# Patient Record
Sex: Male | Born: 2000 | Race: White | Hispanic: No | Marital: Single | Smoking: Never smoker
Health system: Southern US, Community
[De-identification: ages and names within clinical notes are randomized; demographics above are authoritative.]

---
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
## 2021-01-11 ENCOUNTER — Emergency Department: Payer: Medicaid Other

## 2021-01-11 ENCOUNTER — Other Ambulatory Visit: Payer: Self-pay

## 2021-01-11 ENCOUNTER — Emergency Department
Admission: EM | Admit: 2021-01-11 | Discharge: 2021-01-11 | Disposition: A | Discharge status: Home / Self Care | Payer: Medicaid Other | Attending: Emergency Medicine | Admitting: Emergency Medicine

## 2021-01-11 DIAGNOSIS — R569 Unspecified convulsions: Secondary | ICD-10-CM

## 2021-01-11 DIAGNOSIS — R519 Headache, unspecified: Secondary | ICD-10-CM | POA: Diagnosis not present

## 2021-01-11 LAB — COMPREHENSIVE METABOLIC PANEL
ALT: 20 U/L (ref 0–44)
AST: 31 U/L (ref 15–41)
Albumin: 4.9 g/dL (ref 3.5–5.0)
Alkaline Phosphatase: 84 U/L (ref 38–126)
Anion gap: 12 (ref 5–15)
BUN: <5 mg/dL — ABNORMAL LOW (ref 6–20)
CO2: 22 mmol/L (ref 22–32)
Calcium: 9.5 mg/dL (ref 8.9–10.3)
Chloride: 103 mmol/L (ref 98–111)
Creatinine, Ser: 0.98 mg/dL (ref 0.61–1.24)
GFR, Estimated: >60 mL/min (ref >60)
Glucose, Bld: 130 mg/dL — ABNORMAL HIGH (ref 70–99)
Potassium: 3.6 mmol/L (ref 3.5–5.1)
Sodium: 137 mmol/L (ref 135–145)
Total Bilirubin: 1.1 mg/dL (ref 0.3–1.2)
Total Protein: 9 g/dL — ABNORMAL HIGH (ref 6.5–8.1)

## 2021-01-11 LAB — CBC WITH DIFFERENTIAL/PLATELET
Abs Immature Granulocytes: 0.03 10*3/uL (ref 0.00–0.07)
Basophils Absolute: 0 10*3/uL (ref 0.0–0.1)
Basophils Relative: 0 %
Eosinophils Absolute: 0 10*3/uL (ref 0.0–0.5)
Eosinophils Relative: 0 %
HCT: 45.8 % (ref 39.0–52.0)
Hemoglobin: 16.6 g/dL (ref 13.0–17.0)
Immature Granulocytes: 1 %
Lymphocytes Relative: 34 %
Lymphs Abs: 2 10*3/uL (ref 0.7–4.0)
MCH: 31.6 pg (ref 26.0–34.0)
MCHC: 36.2 g/dL — ABNORMAL HIGH (ref 30.0–36.0)
MCV: 87.1 fL (ref 80.0–100.0)
Monocytes Absolute: 0.2 10*3/uL (ref 0.1–1.0)
Monocytes Relative: 4 %
Neutro Abs: 3.6 10*3/uL (ref 1.7–7.7)
Neutrophils Relative %: 61 %
Platelets: 191 10*3/uL (ref 150–400)
RBC: 5.26 MIL/uL (ref 4.22–5.81)
RDW: 13.4 % (ref 11.5–15.5)
WBC: 5.8 10*3/uL (ref 4.0–10.5)
nRBC: 0 % (ref 0.0–0.2)

## 2021-01-11 LAB — MAGNESIUM: Magnesium: 1.9 mg/dL (ref 1.7–2.4)

## 2021-01-11 LAB — LACTIC ACID, PLASMA
Lactic Acid, Venous: 2.5 mmol/L (ref 0.5–1.9)
Lactic Acid, Venous: 1.1 mmol/L (ref 0.5–1.9)

## 2021-01-11 MED ORDER — LEVETIRACETAM 500 MG PO TABS
500.0000 mg | ORAL_TABLET | Freq: Once | ORAL | Status: AC
  Administered 2021-01-11: 500 mg via ORAL
  Filled 2021-01-11: qty 1

## 2021-01-11 MED ORDER — LEVETIRACETAM 500 MG PO TABS: 500.0000 mg | ORAL_TABLET | Freq: Two times a day (BID) | ORAL | 1 refills | Status: DC

## 2021-01-11 MED ORDER — LACTATED RINGERS IV BOLUS
1000.0000 mL | Freq: Once | INTRAVENOUS | Status: AC
  Administered 2021-01-11: 1000 mL via INTRAVENOUS

## 2021-01-11 MED ORDER — ACETAMINOPHEN 500 MG PO TABS
1000.0000 mg | ORAL_TABLET | Freq: Once | ORAL | Status: AC
  Administered 2021-01-11: 1000 mg via ORAL
  Filled 2021-01-11: qty 2

## 2021-01-11 NOTE — ED Notes (Signed)
Gave md verbal of critical lactate

## 2021-01-11 NOTE — ED Provider Notes (Addendum)
-----------------------------------------   9:21 PM on 01/11/2021 -----------------------------------------  Initial lactate was slightly elevated consistent with a seizure.  Repeat is negative after fluids.  The is little bit sleepy after Keppra but has had no recurrent seizure activity.  He is stable for discharge home.  I counseled him and the father on the results of the work-up and follow-up plan.  Return precautions given, and they expressed understanding.  ED ECG REPORT I, Dionne Bucy, the attending physician, personally viewed and interpreted this ECG.  Date: 01/11/2021 EKG Time: 2021 Rate: 62 Rhythm: normal sinus rhythm QRS Axis: normal Intervals: normal ST/T Wave abnormalities: Early repolarization Narrative Interpretation: no evidence of acute ischemia    Dionne Bucy, MD 01/11/21 2121    Dionne Bucy, MD 01/11/21 2134

## 2021-01-11 NOTE — ED Notes (Signed)
Patient transported to CT 

## 2021-01-11 NOTE — Discharge Instructions (Addendum)
You are being started on an antiseizure medication, Keppra, to prevent further seizures.   As we discussed, please follow-up with neurology in the clinic to discuss starting antiseizure medications and for further testing such as EEG.  Return to the ED with any further seizure-like activity.

## 2021-01-11 NOTE — ED Provider Notes (Signed)
[facility] Emergency Department Provider Note ____________________________________________   Event Date/Time   First MD Initiated Contact with Patient 01/11/21 1816     (approximate)  I have reviewed the triage vital signs and the nursing notes.  HISTORY  Chief Complaint Seizures   HPI Brett Davies. is a 20 y.o. Brett Davies presents to the ED for evaluation of possible seizure.   Chart review indicates no relevant hx.   Patient presents to the ED via EMS from local bike rally where he had witnessed seizure-like activity.  EMS reports finding him confused on the scene, possibly postictal, due to bystanders indicating generalized seizure activity was witnessed for 1-2 minutes.  Patient reports that he took 300 mg of Lyrica recreationally prior to this, but no other ingestions.  No recent illnesses.  He reports feeling fine this morning and yesterday.  He reports he does not know what happened he "just went out" and then woke in the ambulance on the way here.  Majority of history is provided by father, who arrived shortly after the patient.  Father reports the patient abruptly came to him and says that something is wrong and that he did not feel good, and then father witnessed him stare off into space, slumped to the ground, and for 1-2 minutes his bilateral arms were flexed, tight and clenched with fine rhythmic motion.  His bilateral legs are both extended and finely tremulous.  No incontinence of urine or stool.  Here in the ED, patient reports a mild global headache.  No past medical history on file.  There are no problems to display for this patient.   No past surgical history on file.  Prior to Admission medications   Medication Sig Start Date End Date Taking? Authorizing Provider  levETIRAcetam (KEPPRA) 500 MG tablet Take 1 tablet (500 mg total) by mouth 2 (two) times daily. 01/11/21  Yes Delton Prairie, MD  acetaminophen-codeine (TYLENOL #3)  300-30 MG tablet Take 1 tablet by mouth every 4 (four) hours as needed for moderate pain. 08/31/16   Cuthriell, Delorise Royals, PA-C  methylPREDNISolone (MEDROL DOSEPAK) 4 MG TBPK tablet Take Tapered dose as directed 05/27/19   Joni Reining, PA-C  sulfamethoxazole-trimethoprim (BACTRIM DS) 800-160 MG tablet Take 1 tablet by mouth 2 (two) times daily. 05/27/19   Joni Reining, PA-C    Allergies Patient has no known allergies.  Family History  Problem Relation Age of Onset   Healthy Father     Social History Social History   Tobacco Use   Smoking status: Never   Smokeless tobacco: Never  Substance Use Topics   Alcohol use: No   Drug use: No    Review of Systems  Constitutional: No fever/chills Eyes: No visual changes. ENT: No sore throat. Cardiovascular: Denies chest pain. Respiratory: Denies shortness of breath. Gastrointestinal: No abdominal pain.  No nausea, no vomiting.  No diarrhea.  No constipation. Genitourinary: Negative for dysuria. Musculoskeletal: Negative for back pain. Skin: Negative for rash. Neurological: Negative for focal weakness or numbness.  Positive for headache and witnessed seizure-like activity ____________________________________________   PHYSICAL EXAM:  VITAL SIGNS: Vitals:   01/11/21 1822  BP: 122/64  Pulse: (!) 110  Resp: 18  Temp: 98.2 F (36.8 C)  SpO2: 97%     Constitutional: Alert and oriented. Well appearing and in no acute distress.  Sitting up in bed and conversational.  T shirt is wet with sweat. Eyes: Conjunctivae are normal. PERRL. EOMI. Head: Atraumatic. Nose:  No congestion/rhinnorhea. Mouth/Throat: Mucous membranes are dry.  Oropharynx non-erythematous. Neck: No stridor. No cervical spine tenderness to palpation. Cardiovascular: Tachycardic rate, regular rhythm. Grossly normal heart sounds.  Good peripheral circulation. Respiratory: Normal respiratory effort.  No retractions. Lungs CTAB. Gastrointestinal: Soft ,  nondistended, nontender to palpation. No CVA tenderness. Musculoskeletal: No lower extremity tenderness nor edema.  No joint effusions. No signs of acute trauma. Neurologic:  Normal speech and language. No gross focal neurologic deficits are appreciated.  Cranial nerves II through XII intact 5/5 strength and sensation in all 4 extremities Skin:  Skin is warm, dry and intact. No rash noted. Psychiatric: Mood and affect are normal. Speech and behavior are normal. ____________________________________________   LABS (all labs ordered are listed, but only abnormal results are displayed)  Labs Reviewed  COMPREHENSIVE METABOLIC PANEL - Abnormal; Notable for the following components:      Result Value   Glucose, Bld 130 (*)    BUN <5 (*)    Total Protein 9.0 (*)    All other components within normal limits  CBC WITH DIFFERENTIAL/PLATELET - Abnormal; Notable for the following components:   MCHC 36.2 (*)    All other components within normal limits  MAGNESIUM  LACTIC ACID, PLASMA  LACTIC ACID, PLASMA   ____________________________________________  12 Lead EKG   ____________________________________________  RADIOLOGY  ED MD interpretation: CT head reviewed by me without evidence of acute cranial pathology.  Official radiology report(s): CT HEAD WO CONTRAST ( )  Result Date: 01/11/2021 CLINICAL DATA:  New onset seizure. Evaluate for mass or intracranial abnormality. EXAM: CT HEAD WITHOUT CONTRAST TECHNIQUE: Contiguous axial images were obtained from the base of the skull through the vertex without intravenous contrast. COMPARISON:  CT head 07/19/2014. FINDINGS: Brain: No evidence of large-territorial acute infarction. No parenchymal hemorrhage. No mass lesion. No extra-axial collection. No mass effect or midline shift. No hydrocephalus. Basilar cisterns are patent. Vascular: No hyperdense vessel. Skull: No acute fracture or focal lesion. Sinuses/Orbits: Paranasal sinuses and mastoid air  cells are clear. The orbits are unremarkable. Other: None. IMPRESSION: No acute intracranial abnormality. Electronically Signed   By: Tish Frederickson M.D.   On: 01/11/2021 19:16    ____________________________________________   PROCEDURES and INTERVENTIONS  Procedure(s) performed (including Critical Care):  .1-3 Lead EKG Interpretation  Date/Time: 01/11/2021 7:54 PM Performed by: Delton Prairie, MD Authorized by: Delton Prairie, MD     Interpretation: abnormal     ECG rate:  110   ECG rate assessment: tachycardic     Rhythm: sinus tachycardia     Ectopy: none     Conduction: normal    Medications  levETIRAcetam (KEPPRA) tablet 500 mg (has no administration in time range)  lactated ringers bolus 1,000 mL (1,000 mLs Intravenous New Bag/Given 01/11/21 1853)  acetaminophen (TYLENOL) tablet 1,000 mg (1,000 mg Oral Given 01/11/21 1854)    ____________________________________________   MDM / ED COURSE   Otherwise healthy 20 year old male presents to the ED with first-time seizure-like activity.  Presents little sleepy, rapidly improving while in the ED, possibly postictal in etiology.  He has no evidence of trauma or neurologic deficits.  CT without evidence of ICH or mass.  Electrolytes and basic labs are unremarkable.  We will empirically start him on Keppra and have him follow-up with neurology as an outpatient.  Return precautions were discussed.      ____________________________________________   FINAL CLINICAL IMPRESSION(S) / ED DIAGNOSES  Final diagnoses:  Seizure-like activity [facility])     ED Discharge Orders  Ordered    levETIRAcetam (KEPPRA) 500 MG tablet  2 times daily        01/11/21 1951             Shemekia Patane Katrinka Blazing   Note:  This document was prepared using Conservation officer, historic buildings and may include unintentional dictation errors.    Delton Prairie, MD 01/11/21 [id number]

## 2021-01-11 NOTE — ED Triage Notes (Signed)
Pt presents to the ED via EMS with c/o seizure. Pt was at a friend's house when he had witnessed clonic tonic seizure that lasted 1-2 minutes. Pt denies any history of seizures. Pt now A&Ox4 with stable vital signs.

## 2021-09-30 ENCOUNTER — Encounter: Payer: Self-pay | Admitting: Emergency Medicine

## 2021-09-30 ENCOUNTER — Emergency Department
Admission: EM | Admit: 2021-09-30 | Discharge: 2021-09-30 | Disposition: A | Discharge status: Home / Self Care | Payer: Medicaid Other | Attending: Emergency Medicine | Admitting: Emergency Medicine

## 2021-09-30 DIAGNOSIS — X58XXXA Exposure to other specified factors, initial encounter: Secondary | ICD-10-CM | POA: Diagnosis not present

## 2021-09-30 DIAGNOSIS — T1591XA Foreign body on external eye, part unspecified, right eye, initial encounter: Secondary | ICD-10-CM

## 2021-09-30 DIAGNOSIS — T1501XA Foreign body in cornea, right eye, initial encounter: Secondary | ICD-10-CM | POA: Diagnosis present

## 2021-09-30 MED ORDER — TOBRAMYCIN 0.3 % OP SOLN
2.0000 [drp] | Freq: Once | OPHTHALMIC | Status: DC
  Filled 2021-09-30: qty 5

## 2021-09-30 MED ORDER — OFLOXACIN 0.3 % OP SOLN
1.0000 [drp] | Freq: Once | OPHTHALMIC | Status: AC
  Administered 2021-09-30: 1 [drp] via OPHTHALMIC
  Filled 2021-09-30: qty 5

## 2021-09-30 MED ORDER — FLUORESCEIN SODIUM 1 MG OP STRP
1.0000 | ORAL_STRIP | Freq: Once | OPHTHALMIC | Status: AC
  Administered 2021-09-30: 1 via OPHTHALMIC
  Filled 2021-09-30: qty 1

## 2021-09-30 MED ORDER — TETRACAINE HCL 0.5 % OP SOLN
2.0000 [drp] | Freq: Once | OPHTHALMIC | Status: AC
  Administered 2021-09-30: 2 [drp] via OPHTHALMIC
  Filled 2021-09-30: qty 4

## 2021-09-30 NOTE — ED Notes (Signed)
Right eye irrigated with morgan lens using normal saline per Dr. Dolores Frame orders ?

## 2021-09-30 NOTE — ED Notes (Signed)
Pt discharge information reviewed. Pt understands need for follow up care and when to return if symptoms worsen. All questions answered. Pt is alert and oriented with even and regular respirations. Pt is seen ambulating out of department with strong steady gait with family. ?

## 2021-09-30 NOTE — ED Notes (Signed)
Eye irrigation completed. Pt able to tolerate 1000 L NS. ?

## 2021-09-30 NOTE — ED Triage Notes (Signed)
Pt presents via POV with complaints of a pinpoint black dot on his cornea that he noticed last Thursday. Pts Dad notes attempting to get the "speck" out of his eye without success. Denies blurred vision or other vision changes.  ?

## 2021-09-30 NOTE — ED Notes (Signed)
Woods lamp, eye drops, and eye strips placed at bedside  ?

## 2021-09-30 NOTE — Discharge Instructions (Signed)
Apply antibiotic eyedrop 1 drop to right eye 4 times daily while awake x7 days.  Return to the ER for worsening symptoms, persistent vomiting, or other concerns. ?

## 2021-09-30 NOTE — ED Notes (Signed)
Visual acuity  ? ?Right eye 20/25 ?Left eye 20/30 ?Both eyes 20/25 ?

## 2021-09-30 NOTE — ED Provider Notes (Signed)
? ?[facility] ?Provider Note ? ? ? Event Date/Time  ? First MD Initiated Contact with Patient 09/30/21 0354   ?  (approximate) ? ? ?History  ? ?Foreign Body in Eye ? ? ?HPI ? ?Brett Davies. is a 21 y.o. male who presents to the ED from home with his father with a chief complaint of foreign body to his right eye.  Patient was grinding his bike 5 days ago and thinks he got a speck of paint in his right eye.  Denies pain or irritation.  Told his father about it yesterday and his father attempted to get the speck out of his right eye with a Q-tip.  Father describes a speck embedded in the surface of patient's eye.  Patient does not wear corrective lenses.  Denies blurry vision or other vision changes. ?  ? ? ?Past Medical History  ?History reviewed. No pertinent past medical history. ? ? ?Active Problem List  ?There are no problems to display for this patient. ? ? ? ?Past Surgical History  ?History reviewed. No pertinent surgical history. ? ? ?Home Medications  ? ?Prior to Admission medications   ?Medication Sig Start Date End Date Taking? Authorizing Provider  ?acetaminophen-codeine (TYLENOL #3) 300-30 MG tablet Take 1 tablet by mouth every 4 (four) hours as needed for moderate pain. 08/31/16   Cuthriell, Delorise Royals, PA-C  ?levETIRAcetam (KEPPRA) 500 MG tablet Take 1 tablet (500 mg total) by mouth 2 (two) times daily. 01/11/21   Delton Prairie, MD  ?methylPREDNISolone (MEDROL DOSEPAK) 4 MG TBPK tablet Take Tapered dose as directed 05/27/19   Joni Reining, PA-C  ?sulfamethoxazole-trimethoprim (BACTRIM DS) 800-160 MG tablet Take 1 tablet by mouth 2 (two) times daily. 05/27/19   Joni Reining, PA-C  ? ? ? ?Allergies  ?Patient has no known allergies. ? ? ?Family History  ? ?Family History  ?Problem Relation Age of Onset  ? Healthy Father   ? ? ? ?Physical Exam  ?Triage Vital Signs: ?ED Triage Vitals  ?Enc Vitals Group  ?   BP 09/30/21 0047 116/80  ?   Pulse Rate 09/30/21 0047 62  ?   Resp  09/30/21 0047 17  ?   Temp 09/30/21 0047 97.8 ?F (36.6 ?C)  ?   Temp Source 09/30/21 0047 Oral  ?   SpO2 09/30/21 0047 100 %  ?   Weight 09/30/21 0046 124 lb (56.2 kg)  ?   Height 09/30/21 0046 5\' 8"  (1.727 m)  ?   Head Circumference --   ?   Peak Flow --   ?   Pain Score 09/30/21 0046 8  ?   Pain Loc --   ?   Pain Edu? --   ?   Excl. in [place]? --   ? ? ?Updated Vital Signs: ?BP 116/80 (BP Location: Left Arm)   Pulse 62   Temp 97.8 ?F (36.6 ?C) (Oral)   Resp 17   Ht 5\' 8"  (1.727 m)   Wt 56.2 kg   SpO2 100%   BMI 18.85 kg/m?  ? ? ?General: Awake, no distress.  ?CV:  Good peripheral perfusion.  ?Resp:  Normal effort.  ?Abd:  No distention.  ?Other:  VA noted. Right eye numbed with tetracaine, stained with fluorescein and examined under Woods lamp.  PERRL.  EOMI.  Globe is intact.  Tiny dot in the center of patient's cornea. ? ? ?ED Results / Procedures / Treatments  ?Labs ?(all labs ordered are  listed, but only abnormal results are displayed) ?Labs Reviewed - No data to display ? ? ?EKG ? ?None ? ? ?RADIOLOGY ?None ? ? ?Official radiology report(s): ?No results found. ? ? ?PROCEDURES: ? ?Critical Care performed: No ? ?Procedures ? ? ?MEDICATIONS ORDERED IN ED: ?Medications  ?ofloxacin (OCUFLOX) 0.3 % ophthalmic solution 1 drop (has no administration in time range)  ?tetracaine (PONTOCAINE) 0.5 % ophthalmic solution 2 drop (2 drops Left Eye Given by Other 09/30/21 0452)  ?fluorescein ophthalmic strip 1 strip (1 strip Left Eye Given by Other 09/30/21 0454)  ? ? ? ?IMPRESSION / MDM / ASSESSMENT AND PLAN / ED COURSE  ?I reviewed the triage vital signs and the nursing notes. ?             ?               ?21 year old male presenting with right eye foreign body.  Tiny dot noted to central cornea, presumably foreign body.  Will irrigate with Lequita Halt lens.  Apply Tobrex drops.  Will reassess. ? ?0435 ?Pharmacy out of Tobrex eyedrops; will apply Ocuflox. ? ?0622 ?Reexamined right eye with Woods lamp after irrigation.  Foreign  body appears to be gone.  Father had also looked at the eye and thought foreign body was gone as well.  Out of an abundance of caution, encourage patient to continue antibiotic eyedrops and follow up closely with ophthalmology.  Strict return precautions given.  Patient and family member verbalized understanding and agree with plan of care. ? ?FINAL CLINICAL IMPRESSION(S) / ED DIAGNOSES  ? ?Final diagnoses:  ?Eye foreign body, right, initial encounter  ? ? ? ?Rx / DC Orders  ? ?ED Discharge Orders   ? ? None  ? ?  ? ? ? ?Note:  This document was prepared using Dragon voice recognition software and may include unintentional dictation errors. ?  ?Irean Hong, MD ?09/30/21 [phone] ? ?

## 2024-05-04 ENCOUNTER — Emergency Department

## 2024-05-04 ENCOUNTER — Other Ambulatory Visit: Payer: Self-pay

## 2024-05-04 ENCOUNTER — Emergency Department
Admission: EM | Admit: 2024-05-04 | Discharge: 2024-05-04 | Disposition: A | Discharge status: Home / Self Care | Source: Home / Self Care | Attending: Emergency Medicine | Admitting: Emergency Medicine

## 2024-05-04 DIAGNOSIS — M62830 Muscle spasm of back: Secondary | ICD-10-CM | POA: Diagnosis present

## 2024-05-04 MED ORDER — LIDOCAINE 5 % EX PTCH
1.0000 | MEDICATED_PATCH | CUTANEOUS | Status: DC
  Administered 2024-05-04: 20:00:00 1 via TRANSDERMAL
  Filled 2024-05-04: qty 1

## 2024-05-04 MED ORDER — CYCLOBENZAPRINE HCL 5 MG PO TABS: 5.0000 mg | ORAL_TABLET | Freq: Three times a day (TID) | ORAL | 0 refills | Status: DC | PRN

## 2024-05-04 MED ORDER — CYCLOBENZAPRINE HCL 10 MG PO TABS
5.0000 mg | ORAL_TABLET | Freq: Once | ORAL | Status: AC
  Administered 2024-05-04: 20:00:00 5 mg via ORAL
  Filled 2024-05-04: qty 1

## 2024-05-04 MED ORDER — IBUPROFEN 800 MG PO TABS
800.0000 mg | ORAL_TABLET | Freq: Once | ORAL | Status: AC
  Administered 2024-05-04: 20:00:00 800 mg via ORAL
  Filled 2024-05-04: qty 1

## 2024-05-04 NOTE — ED Provider Notes (Signed)
° ° °  [facility] Emergency Department Provider Note     Event Date/Time   First MD Initiated Contact with Patient 05/04/24 Brett Davies     (approximate)   History   Back Pain   HPI  Brett Davies. is a 23 y.o. male ***       Physical Exam   Triage Vital Signs: ED Triage Vitals [05/04/24 1721]  Encounter Vitals Group     BP 124/78     Girls Systolic BP Percentile      Girls Diastolic BP Percentile      Boys Systolic BP Percentile      Boys Diastolic BP Percentile      Pulse Rate (!) 107     Resp 18     Temp 98.1 F (36.7 C)     Temp Source Oral     SpO2 99 %     Weight 122 lb (55.3 kg)     Height 5' 10 (1.778 m)     Head Circumference      Peak Flow      Pain Score 9     Pain Loc      Pain Education      Exclude from Growth Chart     Most recent vital signs: Vitals:   05/04/24 1721  BP: 124/78  Pulse: (!) 107  Resp: 18  Temp: 98.1 F (36.7 C)  SpO2: 99%    General Awake, no distress. *** {**HEENT NCAT. PERRL. EOMI. No rhinorrhea. Mucous membranes are moist. **} CV:  Good peripheral perfusion. *** RESP:  Normal effort. *** ABD:  No distention. *** {**Other: **}   ED Results / Procedures / Treatments   Labs (all labs ordered are listed, but only abnormal results are displayed) Labs Reviewed - No data to display  EKG  ***  RADIOLOGY  {**I personally viewed and evaluated these images as part of my medical decision making, as well as reviewing the written report by the radiologist.  ED Provider Interpretation: ***  No results found.  PROCEDURES:  Critical Care performed: {CriticalCareYesNo:19197::Yes, see critical care procedure note(s),No}  Procedures   MEDICATIONS ORDERED IN ED: Medications - No data to display   IMPRESSION / MDM / ASSESSMENT AND PLAN / ED COURSE  I reviewed the triage vital signs and the nursing notes.                                 23 y.o. male presents to the emergency  department for evaluation and treatment of ***. See HPI for further details.   Differential diagnosis includes, but is not limited to ***   Patient's presentation is most consistent with {EM COPA:27473}  {**The patient is on the cardiac monitor to evaluate for evidence of arrhythmia and/or significant heart rate changes.**}  The patient is in stable and satisfactory condition for discharge home. Encouraged to follow up with *** for further management. ED precautions discussed. All questions and concerns were addressed during this ED visit.     FINAL CLINICAL IMPRESSION(S) / ED DIAGNOSES   Final diagnoses:  None     Rx / DC Orders   ED Discharge Orders     None        Note:  This document was prepared using Dragon voice recognition software and may include unintentional dictation errors.

## 2024-05-04 NOTE — ED Triage Notes (Addendum)
 Pt here with back pain for a week but getting worse. Pt states pain is upper lumbar in region and describes the pain as sharp in nature. Pt denies any falls or injuries.

## 2024-05-04 NOTE — Discharge Instructions (Addendum)
 You were evaluated in the ED for back pain.  Your lumbar and thoracic spine x-rays are normal.  Please get plenty of rest and take medication as discussed.  If any new or worsening symptoms occur please return to ED for further evaluation.  Pain control:  Ibuprofen  (motrin /aleve/advil ) - You can take 3 tablets (600 mg) every 6 hours as needed for pain.  Acetaminophen  (tylenol ) - You can take 2 extra strength tablets (1000 mg) every 6 hours as needed for pain.  You can alternate these medications or take them together.  Make sure you eat food/drink water when taking these medications.

## 2024-06-13 ENCOUNTER — Ambulatory Visit: Payer: Self-pay

## 2024-06-13 DIAGNOSIS — Z113 Encounter for screening for infections with a predominantly sexual mode of transmission: Secondary | ICD-10-CM

## 2024-06-13 LAB — HM HIV SCREENING LAB: HM HIV Screening: NEGATIVE

## 2024-06-13 NOTE — Progress Notes (Signed)
 " [facility] Department STI clinic [address] [place] [address] Main phone: [phone]  STI screening visit  Subjective:  Brett Davies. is a 24 y.o. male being seen today for an STI screening visit. The patient reports they do not have symptoms.    Patient has the following medical conditions:  There are no active problems to display for this patient.  Chief Complaint  Patient presents with   SEXUALLY TRANSMITTED DISEASE   HPI Patient reports desire for STI testing. No symptoms.  Reproductive considerations: Does the patient or their partner desires a pregnancy in the next year? No  See flowsheet for further details and programmatic requirements  Hyperlink available at the top of the signed note in blue.  Flow sheet content below:  Pregnancy Intention Screening Does the patient want to become pregnant in the next year?: N/A Does the patient's partner want to become pregnant in the next year?: No Would the patient like to discuss contraceptive options today?: N/A Reason For STD Screen STD Screening: Is asymptomatic Have you ever had an STD?: No History of Antibiotic use in the past 2 weeks?: No STD Symptoms Denies all: Yes Risk Factors for Hep B Household, sexual, or needle sharing contact of a person infected with Hep B: No Sexual contact with a person who uses drugs not as prescribed?: No Currently or Ever used drugs not as prescribed: No HIV Positive: No PRep Patient: No Men who have sex with men: No Have Hepatitis C: No History of Incarceration: No History of Homeslessness?: No Anal sex following anal drug use?: No Risk Factors for Hep C Currently using drugs not as prescribed: No Sexual partner(s) currently using drugs as not prescribed: No History of drug use: No HIV Positive: No People with a history of incarceration: No People born between the years of 56 and 62: No Counseling Patient  counseled to use condoms with all sex: Condoms declined RTC in 2-3 weeks for test results: Yes Clinic will call if test results abnormal before test result appt.: Yes Immunizations: Referred, Immunization history assessed Test results given to patient Patient counseled to use condoms with all sex: Condoms declined  Screening for MPX risk:  Unexplained rash?  No   MSM?  No   Multiple or anonymous sex partners?  No   Any close or sexual contact with a person  diagnosed with MPX?  No   Any outside the [place]  where MPX is endemic?  No   High clinical suspicion for MPX?    -Unlikely to be chickenpox    -Lymphadenopathy    -Rash that presents in same phase of       evolution on any given body part  No   Does this patient meet CDC recommendations for vaccination against MPOX? No  You already have or anticipate having the following risks:  Your sex partner has the following risks: You're traveling to a county with a clade I MPOX outbreak and anticipate these risks: Occupational exposure  You had known or suspected exposure to someone with monkeypox You had a sex partner in the past 2 weeks who was diagnosed with monkeypox You are a gay, bisexual, or other man who has sex with men, or are transgender or nonbinary and in the past 6 months have had any of the following: - A new diagnosis of one or more sexually transmitted diseases (e.g., chlamydia, gonorrhea, or syphilis) - More than one sex partner You have had any  of the following in the past 6 months: - Sex at a commercial sex venue (like a sex club or bathhouse) - Sex related to a large commercial event   or in a geographic area (city or county for example) where mpox virus transmission is occurring Sex with a new partner Sex at a commercial sex venue (e.g., a sex club or bathhouse) Sex in it consultant for money, goods, drugs, or other trade Sex in association with a large public event (e.g., a rave, party, or festival) i.e. certain people who  work in a tax adviser or healthcare facility   Infectious disease screenings: Vaccinated against HPV? Yes  HIV Ever had a positive? No Last test: unknown Results in chart:  No results found for: HMHIVSCREEN No results found for: HIV   Hep B Hep B status: unknown or no prior testing Received HBV vaccination? Yes Received HBV testing for immunity? Unknown Results in chart:  No components found for: HMHEPBSCREEN  Do they qualify for HBV screening today? No Criteria:  -Household, sexual or needle sharing contact with HBV -History of drug use or homelessness -HIV positive -Those with known Hep C  Hep C Hep C status: unknown or no prior testing Results in chart:  No results found for: HMHEPCSCREEN No components found for: HEPC  Do they qualify for HCV screening today? No Criteria - since the last HCV result, does the patient have any of the following? - Current drug use - Have a partner with drug use - Has been incarcerated  Immunization history:   There is no immunization history on file for this patient.  The following portions of the patient's history were reviewed and updated as appropriate: allergies, current medications, past medical history, past social history, past surgical history and problem list.  Substance use screenings:  Uses tobacco products? No Uses vapes? Yes Uses alcohol? No Uses non-injectable substances that alter your mental status? No Uses non-prescribed injectable substances? No   There is no immunization history on file for this patient.  The following portions of the patient's history were reviewed and updated as appropriate: allergies, current medications, past medical history, past social history, past surgical history and problem list.  Objective:  There were no vitals filed for this visit.  Physical Exam Vitals and nursing note reviewed.  Constitutional:      Appearance: Normal appearance.  HENT:     Head: Normocephalic and  atraumatic.     Comments: No nits or hair loss of scalp, brows, and lashes    Mouth/Throat:     Mouth: Mucous membranes are moist.     Pharynx: Oropharynx is clear. No oropharyngeal exudate or posterior oropharyngeal erythema.  Eyes:     General:        Right eye: No discharge.        Left eye: No discharge.     Conjunctiva/sclera: Conjunctivae normal.     Right eye: Right conjunctiva is not injected. No exudate.    Left eye: Left conjunctiva is not injected. No exudate. Pulmonary:     Effort: Pulmonary effort is normal.  Abdominal:     Tenderness: There is no rebound.  Genitourinary:    Comments: Politely declined genital exam. Lymphadenopathy:     Cervical: No cervical adenopathy.     Upper Body:     Right upper body: No supraclavicular adenopathy.     Left upper body: No supraclavicular adenopathy.     Comments: Patient declines genital exam. Inguinal lymph nodes not assessed.  Skin:    General: Skin is warm and dry.     Findings: No lesion or rash.  Neurological:     Mental Status: He is alert and oriented to person, place, and time.    Assessment and Plan:  Chauncy Mangiaracina. is a 24 y.o. male presenting to the [facility] Department for STI screening.  Patient accepted the following screenings: urine CT/GC, HIV, and RPR  1. Screening for venereal disease (Primary)  - Chlamydia/GC NAA, Confirmation - Syphilis Serology, [facility] - HIV [facility]   Counseling: Recommended condom use with all sex Discussed importance of condom use for STI prevention Discussed time line for [facility] results and that patient will be called with positive results and encouraged patient to call if they had not heard in 2 weeks Recommended repeat testing in 3 months with positive results. Recommended returning for continued or worsening symptoms.   Return for STI testing as needed.  Damien FORBES Satchel, NP "

## 2024-06-13 NOTE — Progress Notes (Signed)
 Pt is here STD screening. Condoms declined. Austine Lefort, RN.

## 2024-06-15 ENCOUNTER — Ambulatory Visit: Payer: Self-pay | Admitting: Family Medicine

## 2024-06-15 LAB — CHLAMYDIA/GC NAA, CONFIRMATION
Chlamydia trachomatis, NAA: NEGATIVE
Neisseria gonorrhoeae, NAA: NEGATIVE
